# Patient Record
Sex: Male | Born: 1980 | Race: White | Hispanic: No | Marital: Married | Smoking: Former smoker
Health system: Southern US, Community
[De-identification: ages and names within clinical notes are randomized; demographics above are authoritative.]

## PROBLEM LIST (undated history)

## (undated) HISTORY — PX: ANTERIOR CRUCIATE LIGAMENT REPAIR: SHX115

## (undated) HISTORY — PX: POSTERIOR CRUCIATE LIGAMENT RECONSTRUCTION: SHX749

---

## 2018-04-19 ENCOUNTER — Other Ambulatory Visit: Payer: Self-pay

## 2018-04-19 ENCOUNTER — Emergency Department (HOSPITAL_BASED_OUTPATIENT_CLINIC_OR_DEPARTMENT_OTHER): Payer: Self-pay

## 2018-04-19 ENCOUNTER — Emergency Department (HOSPITAL_BASED_OUTPATIENT_CLINIC_OR_DEPARTMENT_OTHER)
Admission: EM | Admit: 2018-04-19 | Discharge: 2018-04-19 | Disposition: A | Discharge status: Home / Self Care | Payer: Self-pay | Attending: Emergency Medicine | Admitting: Emergency Medicine

## 2018-04-19 ENCOUNTER — Encounter (HOSPITAL_BASED_OUTPATIENT_CLINIC_OR_DEPARTMENT_OTHER): Payer: Self-pay

## 2018-04-19 ENCOUNTER — Emergency Department (HOSPITAL_COMMUNITY): Payer: Self-pay

## 2018-04-19 DIAGNOSIS — R200 Anesthesia of skin: Secondary | ICD-10-CM

## 2018-04-19 DIAGNOSIS — Z87891 Personal history of nicotine dependence: Secondary | ICD-10-CM | POA: Insufficient documentation

## 2018-04-19 DIAGNOSIS — G43109 Migraine with aura, not intractable, without status migrainosus: Secondary | ICD-10-CM

## 2018-04-19 LAB — CBC WITH DIFFERENTIAL/PLATELET
Abs Immature Granulocytes: 0.04 10*3/uL (ref 0.00–0.07)
Basophils Absolute: 0.1 10*3/uL (ref 0.0–0.1)
Basophils Relative: 0 %
Eosinophils Absolute: 0.1 10*3/uL (ref 0.0–0.5)
Eosinophils Relative: 1 %
HCT: 44.2 % (ref 39.0–52.0)
Hemoglobin: 14.4 g/dL (ref 13.0–17.0)
Immature Granulocytes: 0 %
Lymphocytes Relative: 16 %
Lymphs Abs: 2.5 10*3/uL (ref 0.7–4.0)
MCH: 28.9 pg (ref 26.0–34.0)
MCHC: 32.6 g/dL (ref 30.0–36.0)
MCV: 88.6 fL (ref 80.0–100.0)
Monocytes Absolute: 1 10*3/uL (ref 0.1–1.0)
Monocytes Relative: 7 %
NRBC: 0 % (ref 0.0–0.2)
Neutro Abs: 11.3 10*3/uL — ABNORMAL HIGH (ref 1.7–7.7)
Neutrophils Relative %: 76 %
Platelets: 438 10*3/uL — ABNORMAL HIGH (ref 150–400)
RBC: 4.99 MIL/uL (ref 4.22–5.81)
RDW: 13.2 % (ref 11.5–15.5)
WBC: 15 10*3/uL — AB (ref 4.0–10.5)

## 2018-04-19 LAB — CBG MONITORING, ED: Glucose-Capillary: 140 mg/dL — ABNORMAL HIGH (ref 70–99)

## 2018-04-19 LAB — COMPREHENSIVE METABOLIC PANEL
ALBUMIN: 4.4 g/dL (ref 3.5–5.0)
ALT: 13 U/L (ref 0–44)
AST: 19 U/L (ref 15–41)
Alkaline Phosphatase: 64 U/L (ref 38–126)
Anion gap: 7 (ref 5–15)
BUN: 12 mg/dL (ref 6–20)
CHLORIDE: 104 mmol/L (ref 98–111)
CO2: 26 mmol/L (ref 22–32)
Calcium: 9.4 mg/dL (ref 8.9–10.3)
Creatinine, Ser: 0.97 mg/dL (ref 0.61–1.24)
GFR calc Af Amer: >60 mL/min (ref >60)
GFR calc non Af Amer: >60 mL/min (ref >60)
Glucose, Bld: 143 mg/dL — ABNORMAL HIGH (ref 70–99)
Potassium: 3.3 mmol/L — ABNORMAL LOW (ref 3.5–5.1)
Sodium: 137 mmol/L (ref 135–145)
Total Bilirubin: 0.7 mg/dL (ref 0.3–1.2)
Total Protein: 7.8 g/dL (ref 6.5–8.1)

## 2018-04-19 LAB — TROPONIN I
Troponin I: <0.03 ng/mL (ref <0.03)
Troponin I: <0.03 ng/mL (ref <0.03)

## 2018-04-19 MED ORDER — PROMETHAZINE HCL 25 MG/ML IJ SOLN
25.0000 mg | Freq: Once | INTRAMUSCULAR | Status: AC
  Administered 2018-04-19: 25 mg via INTRAVENOUS

## 2018-04-19 MED ORDER — PROMETHAZINE HCL 25 MG/ML IJ SOLN
INTRAMUSCULAR | Status: AC
  Filled 2018-04-19: qty 1

## 2018-04-19 MED ORDER — SODIUM CHLORIDE 0.9% FLUSH
3.0000 mL | Freq: Once | INTRAVENOUS | Status: DC
  Filled 2018-04-19: qty 3

## 2018-04-19 MED ORDER — MAGNESIUM SULFATE 2 GM/50ML IV SOLN
2.0000 g | Freq: Once | INTRAVENOUS | Status: AC
  Administered 2018-04-19: 2 g via INTRAVENOUS
  Filled 2018-04-19: qty 50

## 2018-04-19 MED ORDER — IOPAMIDOL (ISOVUE-370) INJECTION 76%
100.0000 mL | Freq: Once | INTRAVENOUS | Status: AC | PRN
  Administered 2018-04-19: 100 mL via INTRAVENOUS

## 2018-04-19 MED ORDER — GABAPENTIN 100 MG PO CAPS: 100.0000 mg | ORAL_CAPSULE | Freq: Three times a day (TID) | ORAL | 0 refills | Status: AC

## 2018-04-19 MED ORDER — VALPROATE SODIUM 500 MG/5ML IV SOLN
1000.0000 mg | Freq: Once | INTRAVENOUS | Status: AC
  Administered 2018-04-19: 1000 mg via INTRAVENOUS
  Filled 2018-04-19: qty 10

## 2018-04-19 MED ORDER — DEXAMETHASONE SODIUM PHOSPHATE 10 MG/ML IJ SOLN
4.0000 mg | Freq: Once | INTRAMUSCULAR | Status: AC
  Administered 2018-04-19: 4 mg via INTRAVENOUS
  Filled 2018-04-19: qty 1

## 2018-04-19 MED ORDER — SODIUM CHLORIDE 0.9 % IV BOLUS
1000.0000 mL | Freq: Once | INTRAVENOUS | Status: AC
  Administered 2018-04-19: 1000 mL via INTRAVENOUS

## 2018-04-19 NOTE — ED Notes (Signed)
Pt ambulatory to bathroom, reports "pressure" in the front of his head.

## 2018-04-19 NOTE — Consult Note (Signed)
[facility]urology Consult Services   Date of Service:   04/19/2018 13:14:22  Impression:     .  RO Acute Ischemic Stroke     .  Right Hemispheric Infarct     .  question complicated migraine versus vascular event or cardiac event  Comments: The patient has the onset of HA/tingling/left eye blurry vision. Almost all symptoms resolved. Suspect migraine phenomenon but needs workup. WOuld start asa in meantime  Mechanism of Stroke: Not Clear  Metrics: Last Known Well: 04/19/2018 11:22:50 [facility] Notification Time: 04/19/2018 13:13:23 Arrival Time: 04/19/2018 12:55:00 Stamp Time: 04/19/2018 13:14:22 Time First Login Attempt: 04/19/2018 13:19:55 Video Start Time: 04/19/2018 13:19:55  Symptoms: slurred speech NIHSS Start Assessment Time: 04/19/2018 13:38:22 Patient is not a candidate for tPA. Patient was not deemed candidate for tPA thrombolytics because of Resolved symptoms. Video End Time: 04/19/2018 13:47:17  CT head showed no acute hemorrhage or acute core infarct.  Advanced imaging was reviewed, No Indication of Large Vessel Occlusive Thrombus.   Radiologist was not called back for review of advanced imaging because exam not c/w lvo received read from nurse ED Physician notified of diagnostic impression and management plan on 04/19/2018 13:47:34  Our recommendations are outlined below.  Recommendations:     .  Activate Stroke Protocol Admission/Order Set     .  Stroke/Telemetry Floor     .  Neuro Checks     .  Bedside Swallow Eval     .  DVT Prophylaxis     .  IV Fluids, Normal Saline     .  Head of Bed Below 30 Degrees     .  Euglycemia and Avoid Hyperthermia (PRN Acetaminophen)     .  Initiate Aspirin 325 MG Daily  Routine Consultation with Inhouse Neurology for Follow up Care  Sign Out:     .  Discussed with Emergency Department Provider    ------------------------------------------------------------------------------  History of  Present Illness: Patient is a 38 year old Male.  Patient was brought by private transportation with symptoms of slurred speech  He developed left arm numbness, CP and slurred speech with SOB. He has PCKD. He contacted his spouse from home at 12:26 saying he didn't feel well. Woke up normal. Former smoker quit one year ago. His whole body was weak all over also. He developed a HA then asleep with his arm feeling cold/asleep.  CT head showed no acute hemorrhage or acute core infarct.   Examination: 1A: Level of Consciousness - Alert; keenly responsive + 0 1B: Ask Month and Age - Both Questions Right + 0 1C: Blink Eyes & Squeeze Hands - Performs Both Tasks + 0 2: Test Horizontal Extraocular Movements - Normal + 0 3: Test Visual Fields - No Visual Loss + 0 4: Test Facial Palsy (Use Grimace if Obtunded) - Normal symmetry + 0 5A: Test Left Arm Motor Drift - No Drift for 10 Seconds + 0 5B: Test Right Arm Motor Drift - No Drift for 10 Seconds + 0 6A: Test Left Leg Motor Drift - No Drift for 5 Seconds + 0 6B: Test Right Leg Motor Drift - No Drift for 5 Seconds + 0 7: Test Limb Ataxia (FNF/Heel-Shin) - No Ataxia + 0 8: Test Sensation - Mild-Moderate Loss: Less Sharp/More Dull + 1 9: Test Language/Aphasia - Normal; No aphasia + 0 10: Test Dysarthria - Normal + 0 11: Test Extinction/Inattention - No abnormality + 0  NIHSS Score: 1  Patient was informed the Neurology Consult  would happen via TeleHealth consult by way of interactive audio and video telecommunications and consented to receiving care in this manner.  Due to the immediate potential for life-threatening deterioration due to underlying acute neurologic illness, I spent 35 minutes providing critical care. This time includes time for face to face visit via telemedicine, review of medical records, imaging studies and discussion of findings with providers, the patient and/or family.   Dr Jossie Ng   [facility] [phone]

## 2018-04-19 NOTE — ED Provider Notes (Signed)
[facility] EMERGENCY DEPARTMENT Provider Note   CSN: [id number] Arrival date & time: 04/19/18  1255     History   Chief Complaint Chief Complaint  Patient presents with  . Chest Pain    HPI John Whitehead is a 38 y.o. male presenting with chest pain, left arm numbness, left facial numbness, headaches.  Patient states that he was at his desk around 12:25 PM when he had acute onset of headache as well as numbness on the left arm and numbness on the left side of his face.  He also noticed that there is some numbness in the left side of his tongue as well. Denies any trouble speaking or weakness. Moreover, patient had some left-sided chest pain as well.  Patient states that he is otherwise healthy and denies any history of stroke or heart attacks in the past.  Denies any recent travel or history of blood clots.  Patient was noted to be tachycardic in triage.   The history is provided by the patient.    History reviewed. No pertinent past medical history.  There are no active problems to display for this patient.   Past Surgical History:  Procedure Laterality Date  . ANTERIOR CRUCIATE LIGAMENT REPAIR    . POSTERIOR CRUCIATE LIGAMENT RECONSTRUCTION          Home Medications    Prior to Admission medications   Not on File    Family History No family history on file.  Social History Social History   Tobacco Use  . Smoking status: Former Games developer  . Smokeless tobacco: Never Used  Substance Use Topics  . Alcohol use: Yes    Comment: occ  . Drug use: Not Currently    Types: Marijuana     Allergies   Darvon [propoxyphene] and Penicillins   Review of Systems Review of Systems  Cardiovascular: Positive for chest pain.  Neurological: Positive for numbness.  All other systems reviewed and are negative.    Physical Exam Updated Vital Signs BP (!) 149/93   Pulse (!) 109   Temp 98.3 F (36.8 C) (Oral)   Resp 20   Physical Exam Vitals signs and  nursing note reviewed.  Constitutional:      Comments: Uncomfortable   HENT:     Head: Normocephalic.  Eyes:     Extraocular Movements: Extraocular movements intact.     Pupils: Pupils are equal, round, and reactive to light.  Neck:     Musculoskeletal: Normal range of motion and neck supple.  Cardiovascular:     Rate and Rhythm: Regular rhythm.     Heart sounds: Normal heart sounds.     Comments: Tachycardic  Pulmonary:     Effort: Pulmonary effort is normal.     Breath sounds: Normal breath sounds.  Abdominal:     General: Bowel sounds are normal.     Palpations: Abdomen is soft.  Musculoskeletal: Normal range of motion.  Skin:    General: Skin is warm.     Capillary Refill: Capillary refill takes less than 2 seconds.  Neurological:     General: No focal deficit present.     Mental Status: He is alert and oriented to person, place, and time.     Comments: Dec sensation L face and arm. No obvious facial droop. Nl strength bilateral arms and legs. Nl finger to nose bilaterally   Psychiatric:        Mood and Affect: Mood normal.  Behavior: Behavior normal.      ED Treatments / Results  Labs (all labs ordered are listed, but only abnormal results are displayed) Labs Reviewed  CBC WITH DIFFERENTIAL/PLATELET - Abnormal; Notable for the following components:      Result Value   WBC 15.0 (*)    Platelets 438 (*)    Neutro Abs 11.3 (*)    All other components within normal limits  COMPREHENSIVE METABOLIC PANEL - Abnormal; Notable for the following components:   Potassium 3.3 (*)    Glucose, Bld 143 (*)    All other components within normal limits  CBG MONITORING, ED - Abnormal; Notable for the following components:   Glucose-Capillary 140 (*)    All other components within normal limits  TROPONIN I    EKG EKG Interpretation  Date/Time:  Wednesday April 19 2018 12:59:12 EST Ventricular Rate:  111 PR Interval:    QRS Duration: 98 QT Interval:  330 QTC  Calculation: 439 R Axis:   42 Text Interpretation:  Sinus tachycardia Baseline wander in lead(s) V5 No previous ECGs available Confirmed by Richardean CanalYao, Charleigh Correnti H [phone]([id number]) on 04/19/2018 1:20:56 PM Also confirmed by Richardean CanalYao, Evi Mccomb H [phone]), editor Barbette Hairassel, Kerry [phone])  on 04/19/2018 1:47:32 PM   Radiology Ct Angio Head W Or Wo Contrast  Result Date: 04/19/2018 CLINICAL DATA:  Chest pain. Now feels altered. Strange sensation in tongue. LEFT-sided head and arm numbness. EXAM: CT ANGIOGRAPHY HEAD AND NECK TECHNIQUE: Multidetector CT imaging of the head and neck was performed using the standard protocol during bolus administration of intravenous contrast. Multiplanar CT image reconstructions and MIPs were obtained to evaluate the vascular anatomy. Carotid stenosis measurements (when applicable) are obtained utilizing NASCET criteria, using the distal internal carotid diameter as the denominator. CONTRAST:  100mL ISOVUE-370 IOPAMIDOL (ISOVUE-370) INJECTION 76% COMPARISON:  None. FINDINGS: CT HEAD FINDINGS Brain: No evidence of acute infarction, hemorrhage, hydrocephalus, extra-axial collection or mass lesion/mass effect. Vascular: Reported separately. Skull: Calvarium intact. Sinuses: Clear. Orbits: Clear. Review of the MIP images confirms the above findings ASPECTS score = 10 [place] Stroke Program Early CT Score Normal score = 10 CTA NECK FINDINGS Aortic arch: Standard branching. Imaged portion shows no evidence of aneurysm or dissection. No significant stenosis of the major arch vessel origins. Right carotid system: No evidence of dissection, stenosis (50% or greater) or occlusion. Left carotid system: No evidence of dissection, stenosis (50% or greater) or occlusion. Vertebral arteries: LEFT vertebral dominant. No evidence of dissection, stenosis, or occlusion. Skeleton: Unremarkable. Other neck: No masses. Upper chest: No mass or pneumothorax. Review of the MIP images confirms the above findings CTA HEAD FINDINGS Anterior  circulation: No significant stenosis, proximal occlusion, aneurysm, or vascular malformation. Posterior circulation: No significant stenosis, proximal occlusion, aneurysm, or vascular malformation. Venous sinuses: As permitted by contrast timing, patent. Anatomic variants: None of significance. Delayed phase: No abnormal postcontrast enhancement. Review of the MIP images confirms the above findings IMPRESSION: Negative CTA head and neck. ASPECTS score 10. These results were called by telephone at the time of interpretation on 04/19/2018 at 1:50 pm to Dr. Chaney MallingAVID Arlin Sass , who verbally acknowledged these results. Electronically Signed   By: Elsie StainJohn T Curnes M.D.   On: 04/19/2018 13:56   Dg Chest 2 View  Result Date: 04/19/2018 CLINICAL DATA:  Chest pain for 30 minutes.  Left arm numbness. EXAM: CHEST - 2 VIEW COMPARISON:  None. FINDINGS: The heart size and mediastinal contours are within normal limits. Both lungs are clear. The visualized skeletal structures  are unremarkable. IMPRESSION: No active cardiopulmonary disease. Electronically Signed   By: Gerome Samavid  Williams III M.D   On: 04/19/2018 13:44   Ct Angio Neck W Or Wo Contrast  Result Date: 04/19/2018 CLINICAL DATA:  Chest pain. Now feels altered. Strange sensation in tongue. LEFT-sided head and arm numbness. EXAM: CT ANGIOGRAPHY HEAD AND NECK TECHNIQUE: Multidetector CT imaging of the head and neck was performed using the standard protocol during bolus administration of intravenous contrast. Multiplanar CT image reconstructions and MIPs were obtained to evaluate the vascular anatomy. Carotid stenosis measurements (when applicable) are obtained utilizing NASCET criteria, using the distal internal carotid diameter as the denominator. CONTRAST:  100mL ISOVUE-370 IOPAMIDOL (ISOVUE-370) INJECTION 76% COMPARISON:  None. FINDINGS: CT HEAD FINDINGS Brain: No evidence of acute infarction, hemorrhage, hydrocephalus, extra-axial collection or mass lesion/mass effect. Vascular:  Reported separately. Skull: Calvarium intact. Sinuses: Clear. Orbits: Clear. Review of the MIP images confirms the above findings ASPECTS score = 10 [place] Stroke Program Early CT Score Normal score = 10 CTA NECK FINDINGS Aortic arch: Standard branching. Imaged portion shows no evidence of aneurysm or dissection. No significant stenosis of the major arch vessel origins. Right carotid system: No evidence of dissection, stenosis (50% or greater) or occlusion. Left carotid system: No evidence of dissection, stenosis (50% or greater) or occlusion. Vertebral arteries: LEFT vertebral dominant. No evidence of dissection, stenosis, or occlusion. Skeleton: Unremarkable. Other neck: No masses. Upper chest: No mass or pneumothorax. Review of the MIP images confirms the above findings CTA HEAD FINDINGS Anterior circulation: No significant stenosis, proximal occlusion, aneurysm, or vascular malformation. Posterior circulation: No significant stenosis, proximal occlusion, aneurysm, or vascular malformation. Venous sinuses: As permitted by contrast timing, patent. Anatomic variants: None of significance. Delayed phase: No abnormal postcontrast enhancement. Review of the MIP images confirms the above findings IMPRESSION: Negative CTA head and neck. ASPECTS score 10. These results were called by telephone at the time of interpretation on 04/19/2018 at 1:50 pm to Dr. Chaney MallingAVID Cloie Wooden , who verbally acknowledged these results. Electronically Signed   By: Elsie StainJohn T Curnes M.D.   On: 04/19/2018 13:56    Procedures Procedures (including critical care time)  Medications Ordered in ED Medications  sodium chloride flush (NS) 0.9 % injection 3 mL (has no administration in time range)  promethazine (PHENERGAN) 25 MG/ML injection (has no administration in time range)  sodium chloride 0.9 % bolus 1,000 mL (1,000 mLs Intravenous New Bag/Given 04/19/18 1311)  iopamidol (ISOVUE-370) 76 % injection 100 mL (100 mLs Intravenous Contrast Given 04/19/18  1323)  promethazine (PHENERGAN) injection 25 mg (25 mg Intravenous Given 04/19/18 1405)  dexamethasone (DECADRON) injection 4 mg (4 mg Intravenous Given 04/19/18 1358)     Initial Impression / Assessment and Plan / ED Course  I have reviewed the triage vital signs and the nursing notes.  Pertinent labs & imaging results that were available during my care of the patient were reviewed by me and considered in my medical decision making (see chart for details).    John Whitehead is a 38 y.o. male here with L arm and facial numbness, headaches, chest pain. Consider carotid dissection vs ruptured aneurysm vs stroke. Patient within code stroke window so code stroke activated. Teleneurology to see patient. Will get CTA head/neck, labs.   2:09 PM Labs unremarkable. CTA showed no obvious dissection or stenosis. Teleneuro saw patient and recommend MRI brain and treat headaches with migraine cocktail. Still has mild L facial numbness. Plan to transfer to [facility] to get MRI brain,  will need second troponin as well. I notified Dr. Otelia Limes from neurology at [facility], who will see patient if MRI showed a stroke. Dr. Criss Alvine at the ED will be accepting doctor for the patient.    Final Clinical Impressions(s) / ED Diagnoses   Final diagnoses:  Left facial numbness  Nonintractable headache, unspecified chronicity pattern, unspecified headache type    ED Discharge Orders    None       Charlynne Pander, MD 04/19/18 1409

## 2018-04-19 NOTE — Discharge Instructions (Addendum)
Your symptoms are likely due to a complicated migraine.  Take neurontin as prescribed.  Call and follow up with neurologist promptly for further care.  Return if you have any concerns.

## 2018-04-19 NOTE — ED Provider Notes (Signed)
Patient is transferred from [facility] after initially seen evaluate by Dr. Duwayne Heck for evaluation of chest pain.  Patient also endorses left arm and facial numbness along with headache and chest pain.  Initial work-up including CT angiogram of the head and neck without evidence of dissection or stenosis.  Patient transferred to [facility], MRI brain second set of troponin  6:38 PM Brain MRI show no evidence of stroke or acute changes.  I suspect patient's headache and numbness is likely complicated migraine.  We will give Depacon 1 g and magnesium sulfate 2 g via IV as treatment and will repeat delta troponin.  He still endorse some discomfort to his left chest.  Otherwise patient is resting comfortably.  8:23 PM After receiving treatment, patient felt much better.  Negative delta troponin.  At this time patient is stable to be discharged.  Will discharge home with Neurontin 100 milligrams 3 times daily along with outpatient follow-up with neurology for further care.  Return precautions discussed.   Fayrene Helper, PA-C 04/19/18 2026    Virgina Norfolk, DO 04/19/18 2226

## 2018-04-19 NOTE — ED Notes (Signed)
Patient transported to MRI 

## 2018-04-19 NOTE — ED Triage Notes (Addendum)
C/o CP x 30 min-pt states he was seated at his desk when he had sudden onset CP, "weird feeling to the side (left) of head, my left arm was numb"-also with strange feeling to tongue-c/o subsided except for left arm numbness and CP-pt assisted from car to tx area via w/c by EMT

## 2018-04-19 NOTE — ED Notes (Signed)
Patient transported to CT 

## 2020-09-04 IMAGING — CT CT ANGIO HEAD
2 of 14 series · 8 of 33 positions shown · IV contrast (iopamidol)
Comparison: None.

CLINICAL DATA: Chest pain. Now feels altered. Strange sensation in
tongue. LEFT-sided head and arm numbness.

EXAM:
CT ANGIOGRAPHY HEAD AND NECK
TECHNIQUE: Multidetector CT imaging of the head and neck was performed using
the standard protocol during bolus administration of intravenous
contrast. Multiplanar CT image reconstructions and MIPs were
obtained to evaluate the vascular anatomy. Carotid stenosis
measurements (when applicable) are obtained utilizing NASCET
criteria, using the distal internal carotid diameter as the
denominator.
CONTRAST:  100mL ZZVO1U-EL4 IOPAMIDOL (ZZVO1U-EL4) INJECTION 76%

[Series 9: cta head/neck · axial · 0.61mm/px · z∈[-358,+22]mm · 3 of 191 slices shown]
[im 1/191  soft-tissue]
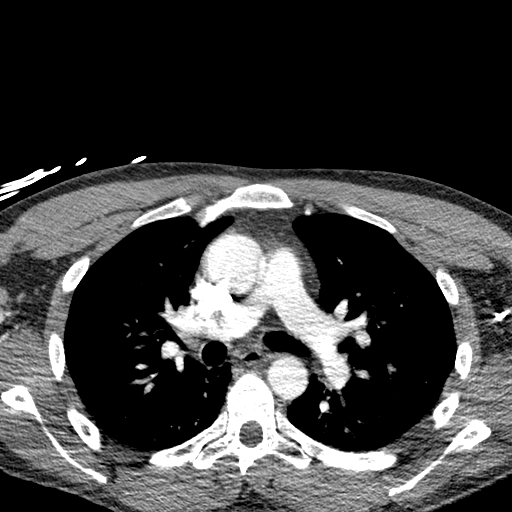
[im 96/191  bone]
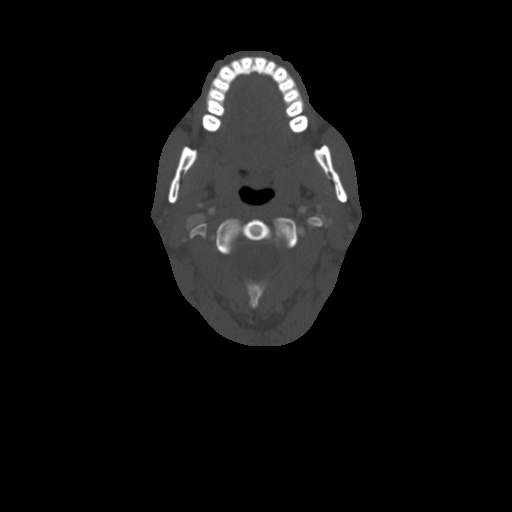
[im 191/191  soft-tissue]
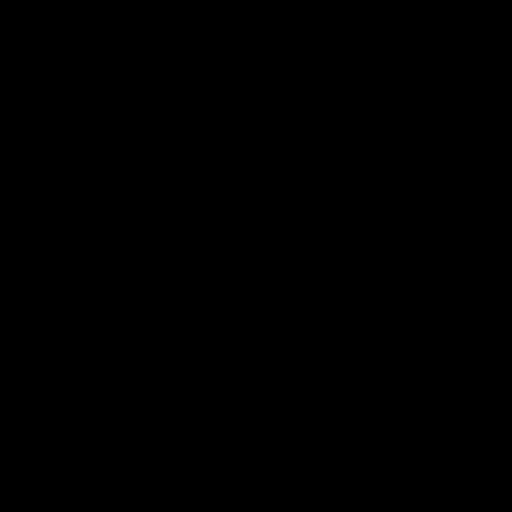

[Series 11: axial thin · axial · 0.46mm/px · z∈[-294,-42]mm · 5 of 380 slices shown]
[im 64/380  soft-tissue]
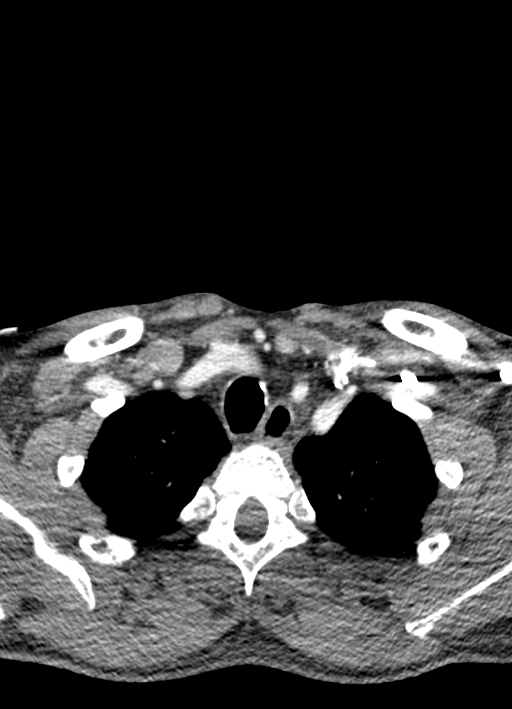
[im 127/380  soft-tissue]
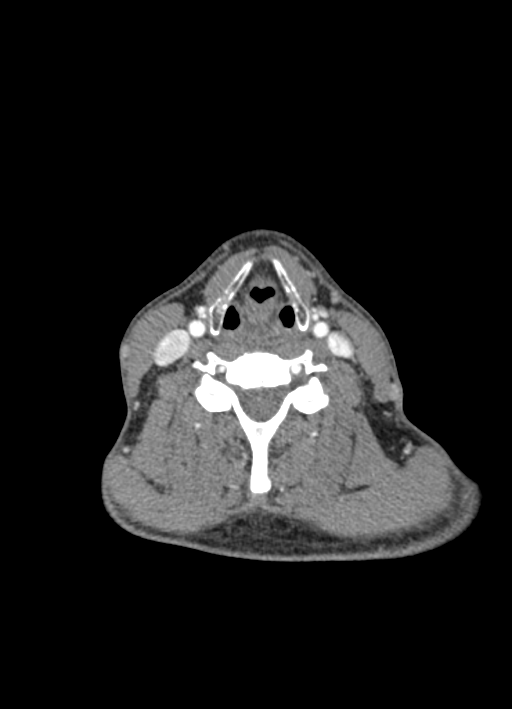
[im 190/380  soft-tissue]
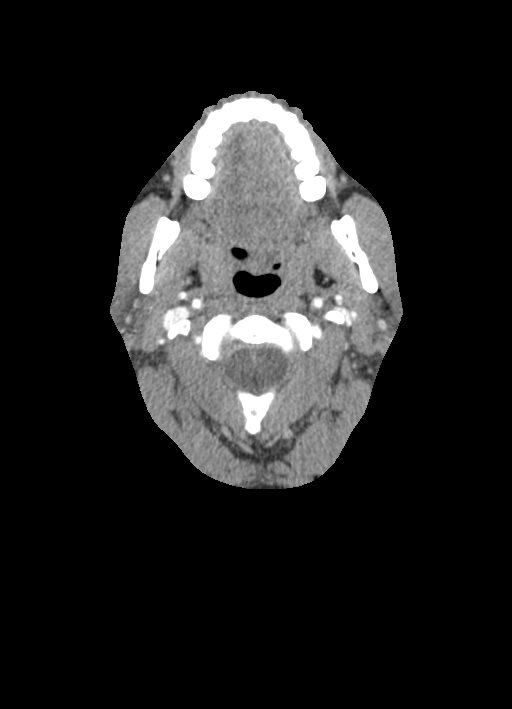
[im 253/380  soft-tissue]
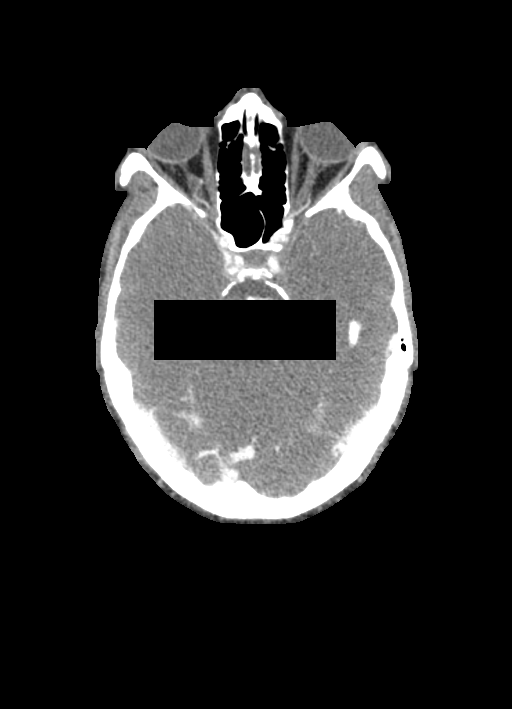
[im 316/380  soft-tissue]
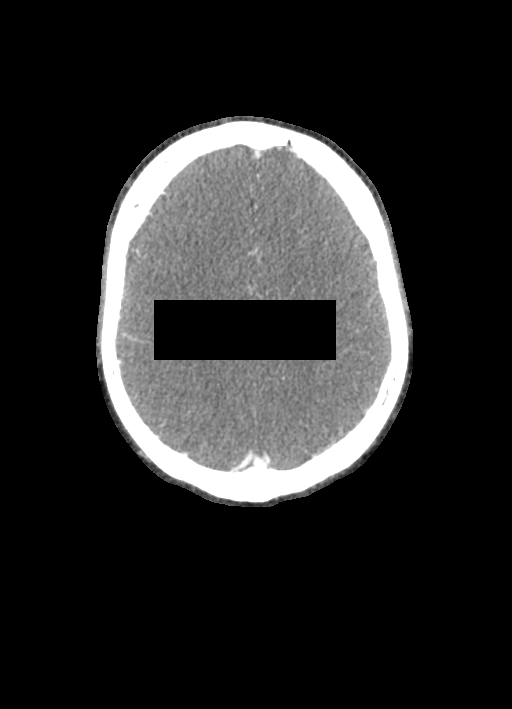

[8 of 33 positions shown; findings below may reference images not displayed]

FINDINGS: CT HEAD FINDINGS

Brain: No evidence of acute infarction, hemorrhage, hydrocephalus,
extra-axial collection or mass lesion/mass effect.

Vascular: Reported separately.

Skull: Calvarium intact.

Sinuses: Clear.

Orbits: Clear.

Review of the MIP images confirms the above findings

ASPECTS score = 10

[place] Stroke Program Early CT Score

Normal score = 10

CTA NECK FINDINGS

Aortic arch: Standard branching. Imaged portion shows no evidence of
aneurysm or dissection. No significant stenosis of the major arch
vessel origins.

Right carotid system: No evidence of dissection, stenosis (50% or
greater) or occlusion.

Left carotid system: No evidence of dissection, stenosis (50% or
greater) or occlusion.

Vertebral arteries: LEFT vertebral dominant. No evidence of
dissection, stenosis, or occlusion.

Skeleton: Unremarkable.

Other neck: No masses.

Upper chest: No mass or pneumothorax.

Review of the MIP images confirms the above findings

CTA HEAD FINDINGS

Anterior circulation: No significant stenosis, proximal occlusion,
aneurysm, or vascular malformation.

Posterior circulation: No significant stenosis, proximal occlusion,
aneurysm, or vascular malformation.

Venous sinuses: As permitted by contrast timing, patent.

Anatomic variants: None of significance.

Delayed phase: No abnormal postcontrast enhancement.

Review of the MIP images confirms the above findings
IMPRESSION: Negative CTA head and neck.

ASPECTS score 10.

These results were called by telephone at the time of interpretation
on 04/19/2018 at [DATE] to Dr. TERINA NARULA , who verbally acknowledged
these results.
